# Patient Record
Sex: Male | Born: 1990 | Race: Black or African American | Hispanic: No | Marital: Single | Smoking: Former smoker
Health system: Southern US, Community
[De-identification: ages and names within clinical notes are randomized; demographics above are authoritative.]

## PROBLEM LIST (undated history)

## (undated) DIAGNOSIS — G43909 Migraine, unspecified, not intractable, without status migrainosus: Secondary | ICD-10-CM

## (undated) HISTORY — PX: NO PAST SURGERIES: SHX2092

## (undated) HISTORY — DX: Migraine, unspecified, not intractable, without status migrainosus: G43.909

## (undated) HISTORY — PX: WISDOM TOOTH EXTRACTION: SHX21

---
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
## 2018-04-03 NOTE — Progress Notes (Signed)
NEUROLOGY CONSULTATION NOTE  Clinton Barbarandrew M Muegge MRN: [id number] DOB: 03/09/1990  Referring provider: Benjiman CoreSuzanne Burks-Bermudez, MD Primary care provider: Benjiman CoreSuzanne Burks-Bermudez, MD  Reason for consult:  headaches  HISTORY OF PRESENT ILLNESS: Clinton Riley is a [age] year old male who presents for headaches.  History supplemented by referring provider note.  Onset:  2012 Location:  Back of head then becomes diffuse Quality:  pulsating Intensity:  Severe.  He denies new headache, thunderclap headache  Aura:  no Prodrome:  no Postdrome:  no Associated symptoms:  Nausea, photophobia, phonophobia, osmophobia, tinnitus, sometimes sees spots in vision.  He denies associated unilateral numbness or weakness. Duration:  2 to 4 hours but sometimes up to 2 days Frequency:  3 days of migraines per month, dull headache 15 days a month Frequency of abortive medication: usually doesn't take anything. Triggers:  Unknown Relieving factors:  Cold rag, massage temples, resting in dark and quiet room Activity:  aggravates  Current NSAIDS:  none Current analgesics:  Excedrin Migraine Current triptans:  none Current ergotamine:  none Current anti-emetic:  none Current muscle relaxants:  none Current anti-anxiolytic:  none Current sleep aide:  none Current Antihypertensive medications:  none Current Antidepressant medications:  none Current Anticonvulsant medications:  none Current anti-CGRP:  none Current Vitamins/Herbal/Supplements:  MVI Current Antihistamines/Decongestants:  none Other therapy:  none Other medication:  none  Past NSAIDS:  Naproxen 500mg , ibuprofen Past analgesics:  Fioricet, Tylenol Past abortive triptans:  Sumatriptan 50mg  Past abortive ergotamine:  none Past muscle relaxants:  Flexeril Past anti-emetic:  none Past antihypertensive medications:  none Past antidepressant medications:  none Past anticonvulsant medications:  none Past anti-CGRP:  none Past  vitamins/Herbal/Supplements:  none Past antihistamines/decongestants:  none Other past therapies:  acupuncture  Caffeine:  No coffee Diet:  Drinks 4 bottles of water daily, rarely soda Exercise:  Walks daily.   Depression:  no; Anxiety:  no Other pain:  Sometimes back pain Sleep hygiene:  Poor.  Has prior diagnosis of OSA.  He had a sleep study yesterday, stay waiting for results. Family history of headache:  No  01/04/18 LABS: NA 139, K3.7, CO2 28, CR 1.260, T bili 0.8, ALT 31, AST 30.  PAST MEDICAL HISTORY: Chronic low back pain (L4-L5) Questionable OSA  PAST SURGICAL HISTORY: None  MEDICATIONS: MVI  ALLERGIES: No known allergies  FAMILY HISTORY: No known family history  SOCIAL HISTORY: Marital status:  Single Lives with sister Tobacco use:  No Alcohol use:  No Illicit drug use:  No  REVIEW OF SYSTEMS: Constitutional: No fevers, chills, or sweats, no generalized fatigue, change in appetite Eyes: No visual changes, double vision, eye pain Ear, nose and throat: No hearing loss, ear pain, nasal congestion, sore throat Cardiovascular: No chest pain, palpitations Respiratory:  No shortness of breath at rest or with exertion, wheezes GastrointestinaI: No nausea, vomiting, diarrhea, abdominal pain, fecal incontinence Genitourinary:  No dysuria, urinary retention or frequency Musculoskeletal:  No neck pain, back pain Integumentary: No rash, pruritus, skin lesions Neurological: as above Psychiatric: No depression, insomnia, anxiety Endocrine: No palpitations, fatigue, diaphoresis, mood swings, change in appetite, change in weight, increased thirst Hematologic/Lymphatic:  No purpura, petechiae. Allergic/Immunologic: no itchy/runny eyes, nasal congestion, recent allergic reactions, rashes  PHYSICAL EXAM: Blood pressure 108/60, pulse 68, height 5\' 9"  (1.753 m), weight 196 lb (88.9 kg), SpO2 97 %. General: No acute distress.  Patient appears well-groomed.   Head:   Normocephalic/atraumatic Eyes:  fundi examined but not visualized Neck: supple, no paraspinal tenderness,  full range of motion Back: No paraspinal tenderness Heart: regular rate and rhythm Lungs: Clear to auscultation bilaterally. Vascular: No carotid bruits. Neurological Exam: Mental status: alert and oriented to person, place, and time, recent and remote memory intact, fund of knowledge intact, attention and concentration intact, speech fluent and not dysarthric, language intact. Cranial nerves: CN I: not tested CN II: pupils equal, round and reactive to light, visual fields intact CN III, IV, VI:  full range of motion, no nystagmus, no ptosis CN V: facial sensation intact CN VII: upper and lower face symmetric CN VIII: hearing intact CN IX, X: gag intact, uvula midline CN XI: sternocleidomastoid and trapezius muscles intact CN XII: tongue midline Bulk & Tone: normal, no fasciculations. Motor:  5/5 throughout  Sensation:  temperature and vibration sensation intact.   Deep Tendon Reflexes:  2+ throughout, toes downgoing.   Finger to nose testing:  Without dysmetria.   Heel to shin:  Without dysmetria.   Gait:  Normal station and stride.  Romberg negative.  IMPRESSION: Chronic migraine without aura, without status migrainosus, not intractable  PLAN: 1.  He would like to avoid medications.   2. For abortive therapy, rizatriptan 10mg  3.  Limit use of pain relievers to no more than 2 days out of week to prevent risk of rebound or medication-overuse headache. 4.  Keep headache diary 5.  Exercise, hydration, caffeine cessation, sleep hygiene, monitor for and avoid triggers 6.  Consider:  magnesium citrate 400mg  daily, riboflavin 400mg  daily, and coenzyme Q10 100mg  three times daily 7.  Follow up in 4 months.   Thank you for allowing me to take part in the care of this patient.  Shon Millet, DO  CC: Benjiman Core, MD

## 2018-04-04 ENCOUNTER — Encounter: Payer: Self-pay | Admitting: Neurology

## 2018-04-04 ENCOUNTER — Ambulatory Visit (INDEPENDENT_AMBULATORY_CARE_PROVIDER_SITE_OTHER): Payer: No Typology Code available for payment source | Admitting: Neurology

## 2018-04-04 VITALS — BP 108/60 | HR 68 | Ht 69.0 in | Wt 196.0 lb

## 2018-04-04 DIAGNOSIS — G43709 Chronic migraine without aura, not intractable, without status migrainosus: Secondary | ICD-10-CM | POA: Diagnosis not present

## 2018-04-04 MED ORDER — RIZATRIPTAN BENZOATE 10 MG PO TABS: ORAL_TABLET | ORAL | 3 refills | Status: DC

## 2018-04-04 NOTE — Patient Instructions (Signed)
Migraine Recommendations: 1.  We will not start a daily preventative medication at this time. 2.  Take rizatriptan 10mg  at earliest onset of headache.  May repeat dose once in 2 hours if needed.  Do not exceed two tablets in 24 hours. 3.  Limit use of pain relievers to no more than 2 days out of the week.  These medications include acetaminophen, ibuprofen, triptans and narcotics.  This will help reduce risk of rebound headaches. 4.  Be aware of common food triggers such as processed sweets, processed foods with nitrites (such as deli meat, hot dogs, sausages), foods with MSG, alcohol (such as wine), chocolate, certain cheeses, certain fruits (dried fruits, bananas, pineapple), vinegar, diet soda. 4.  Avoid caffeine 5.  Routine exercise 6.  Proper sleep hygiene 7.  Stay adequately hydrated with water 8.  Keep a headache diary. 9.  Maintain proper stress management. 10.  Do not skip meals. 11.  Consider supplements:  Magnesium citrate 400mg  to 600mg  daily, riboflavin 400mg , Coenzyme Q 10 100mg  three times daily.  Also consider butterbur. 12.  Follow up in 4 months.   Migraine Headache  A migraine headache is a very strong throbbing pain on one side or both sides of your head. Migraines can also cause other symptoms. Talk with your doctor about what things may bring on (trigger) your migraine headaches. Follow these instructions at home: Medicines  Take over-the-counter and prescription medicines only as told by your doctor.  Do not drive or use heavy machinery while taking prescription pain medicine.  To prevent or treat constipation while you are taking prescription pain medicine, your doctor may recommend that you: ? Drink enough fluid to keep your pee (urine) clear or pale yellow. ? Take over-the-counter or prescription medicines. ? Eat foods that are high in fiber. These include fresh fruits and vegetables, whole grains, and beans. ? Limit foods that are high in fat and processed  sugars. These include fried and sweet foods. Lifestyle  Avoid alcohol.  Do not use any products that contain nicotine or tobacco, such as cigarettes and e-cigarettes. If you need help quitting, ask your doctor.  Get at least 8 hours of sleep every night.  Limit your stress. General instructions   Keep a journal to find out what may bring on your migraines. For example, write down: ? What you eat and drink. ? How much sleep you get. ? Any change in what you eat or drink. ? Any change in your medicines.  If you have a migraine: ? Avoid things that make your symptoms worse, such as bright lights. ? It may help to lie down in a dark, quiet room. ? Do not drive or use heavy machinery. ? Ask your doctor what activities are safe for you.  Keep all follow-up visits as told by your doctor. This is important. Contact a doctor if:  You get a migraine that is different or worse than your usual migraines. Get help right away if:  Your migraine gets very bad.  You have a fever.  You have a stiff neck.  You have trouble seeing.  Your muscles feel weak or like you cannot control them.  You start to lose your balance a lot.  You start to have trouble walking.  You pass out (faint). This information is not intended to replace advice given to you by your health care provider. Make sure you discuss any questions you have with your health care provider. Document Released: 12/01/2007 Document Revised: 11/15/2017  Document Reviewed: 08/10/2015 Elsevier Interactive Patient Education  [employer].

## 2018-08-09 NOTE — Progress Notes (Signed)
Virtual Visit via Video Note The purpose of this virtual visit is to provide medical care while limiting exposure to the novel coronavirus.    Consent was obtained for video visit:  Yes.   Answered questions that patient had about telehealth interaction:  Yes.   I discussed the limitations, risks, security and privacy concerns of performing an evaluation and management service by telemedicine. I also discussed with the patient that there may be a patient responsible charge related to this service. The patient expressed understanding and agreed to proceed.  Pt location: Home Physician Location: office Name of referring provider:  No ref. provider found I connected with Clinton Riley at patients initiation/request on 08/10/2018 at  2:30 PM EDT by video enabled telemedicine application and verified that I am speaking with the correct person using two identifiers. Pt MRN:  [id number] Pt DOB:  01-28-1991 Video Participants:  Clinton Riley   History of Present Illness:  Clinton Riley is a 28 year old man who follows up for migraines.  UPDATE: Overall doing well.  He had one severe one where he had to leave work.  He has been taking Excedrin but has not picked up the rizatriptan from the [place] because he forgot about it.   Intensity:  Moderate to severe Duration:  Unsure because he usually immediately goes to sleep.  The severe one lasted about 2 hours. Frequency:  4 days a month (1 severe) in May.  Frequency fluctuates based on change in weather. Frequency of abortive medication: 4 days in a month Current NSAIDS:  none Current analgesics:  Excedrin Migraine Current triptans:  none Current ergotamine:  none Current anti-emetic:  none Current muscle relaxants:  none Current anti-anxiolytic:  none Current sleep aide:  none Current Antihypertensive medications:  none Current Antidepressant medications:  none Current Anticonvulsant medications:  none Current anti-CGRP:  none Current  Vitamins/Herbal/Supplements:  MVI Current Antihistamines/Decongestants:  none Other therapy:  none Other medication:  none  Caffeine:  No coffee Diet:  Drinks 4 bottles of water daily, rarely soda Exercise:  Walks daily.   Depression:  no; Anxiety:  no Other pain:  Sometimes back pain Sleep hygiene:  Poor.  Has prior diagnosis of OSA.  He is currently be worked up.    HISTORY: Onset:  2012 Location:  Back of head then becomes diffuse Quality:  pulsating Intensity:  Severe.  He denies new headache, thunderclap headache  Aura:  no Prodrome:  no Postdrome:  no Associated symptoms:  Nausea, photophobia, phonophobia, osmophobia, tinnitus, sometimes sees spots in vision.  He denies associated unilateral numbness or weakness. Duration:  2 to 4 hours but sometimes up to 2 days Frequency:  3 days of migraines per month, dull headache 15 days a month Frequency of abortive medication: usually doesn't take anything. Triggers:  Unknown Relieving factors:  Cold rag, massage temples, resting in dark and quiet room Activity:  aggravates  Past NSAIDS:  Naproxen 500mg , ibuprofen Past analgesics:  Fioricet, Tylenol Past abortive triptans:  Sumatriptan 50mg  Past abortive ergotamine:  none Past muscle relaxants:  Flexeril Past anti-emetic:  none Past antihypertensive medications:  none Past antidepressant medications:  none Past anticonvulsant medications:  none Past anti-CGRP:  none Past vitamins/Herbal/Supplements:  none Past antihistamines/decongestants:  none Other past therapies:  acupuncture  Family history of headache:  No  Past Medical History: No past medical history on file.  Medications: Outpatient Encounter Medications as of 08/10/2018  Medication Sig  . Multiple Vitamin (MULTIVITAMIN) tablet Take 1  tablet by mouth daily.  . rizatriptan (MAXALT) 10 MG tablet Take 1 tablet earliest onset of migraine.  May repeat once in 2 hours if needed.  Maximum 2 tablets in 24h   No  facility-administered encounter medications on file as of 08/10/2018.     Allergies: No Known Allergies  Family History: Family History  Problem Relation Age of Onset  . Unexplained death Mother 24    Social History: Social History   Socioeconomic History  . Marital status: Single    Spouse name: Not on file  . Number of children: Not on file  . Years of education: Not on file  . Highest education level: Some college, no degree  Occupational History  . Occupation: Health visitor  Social Needs  . Financial resource strain: Not on file  . Food insecurity:    Worry: Not on file    Inability: Not on file  . Transportation needs:    Medical: Not on file    Non-medical: Not on file  Tobacco Use  . Smoking status: Former Smoker    Types: Pipe  . Smokeless tobacco: Never Used  . Tobacco comment: hookah pipe  Substance and Sexual Activity  . Alcohol use: Yes    Comment: occasionally  . Drug use: Never  . Sexual activity: Not on file  Lifestyle  . Physical activity:    Days per week: Not on file    Minutes per session: Not on file  . Stress: Not on file  Relationships  . Social connections:    Talks on phone: Not on file    Gets together: Not on file    Attends religious service: Not on file    Active member of club or organization: Not on file    Attends meetings of clubs or organizations: Not on file    Relationship status: Not on file  . Intimate partner violence:    Fear of current or ex partner: Not on file    Emotionally abused: Not on file    Physically abused: Not on file    Forced sexual activity: Not on file  Other Topics Concern  . Not on file  Social History Narrative   Patient is right-handed. He lives alone in a one level home. He does not drink caffeine. He walks daily. He occasionally plays tennis and bikes.    Observations/Objective:   Height  (1.753 m), weight 185 lb (83.9 kg). No acute distress.  Alert and oriented.  Speech fluent and not  dysarthric.  Language intact.  Face symmetric.    Assessment and Plan:   Migraine without aura, without status migrainosus, not intractable  1.  No preventative medication warranted at this time 2.  Excedrin for abortive therapy.  He will have rizatriptan for rare events where headaches do not respond to Excedrin. 3.  Limit use of pain relievers to no more than 2 days out of week to prevent risk of rebound or medication-overuse headache. Headache diary 4.  Follow up on sleep apnea testing 5.  Follow up in 6 months.  Follow Up Instructions:    -I discussed the assessment and treatment plan with the patient. The patient was provided an opportunity to ask questions and all were answered. The patient agreed with the plan and demonstrated an understanding of the instructions.   The patient was advised to call back or seek an in-person evaluation if the symptoms worsen or if the condition fails to improve as anticipated.    Total  Time spent in visit with the patient was:  15 minutes  Cira ServantAdam Robert Jaffe, DO

## 2018-08-10 ENCOUNTER — Encounter: Payer: Self-pay | Admitting: Neurology

## 2018-08-10 ENCOUNTER — Telehealth (INDEPENDENT_AMBULATORY_CARE_PROVIDER_SITE_OTHER): Payer: No Typology Code available for payment source | Admitting: Neurology

## 2018-08-10 ENCOUNTER — Other Ambulatory Visit: Payer: Self-pay

## 2018-08-10 VITALS — Ht 69.0 in | Wt 185.0 lb

## 2018-08-10 DIAGNOSIS — G43009 Migraine without aura, not intractable, without status migrainosus: Secondary | ICD-10-CM | POA: Diagnosis not present

## 2018-08-10 DIAGNOSIS — G43909 Migraine, unspecified, not intractable, without status migrainosus: Secondary | ICD-10-CM | POA: Insufficient documentation

## 2019-02-11 NOTE — Progress Notes (Deleted)
Virtual Visit via Video Note The purpose of this virtual visit is to provide medical care while limiting exposure to the novel coronavirus.    Consent was obtained for video visit:  Yes.   Answered questions that patient had about telehealth interaction:  Yes.   I discussed the limitations, risks, security and privacy concerns of performing an evaluation and management service by telemedicine. I also discussed with the patient that there may be a patient responsible charge related to this service. The patient expressed understanding and agreed to proceed.  Pt location: Home Physician Location: office Name of referring provider:  No ref. provider found I connected with Clinton Riley at patients initiation/request on 02/12/2019 at  3:30 PM EST by video enabled telemedicine application and verified that I am speaking with the correct person using two identifiers. Pt MRN:  [id number] Pt DOB:  [birth year] Video Participants:  Clinton Riley   History of Present Illness:  Clinton Riley is a 28 year old man who follows up for migraines.  UPDATE: Intensity:  *** Duration:  *** Frequency:  *** Frequency of abortive medication: 4 days in a month Current NSAIDS:none Current analgesics:Excedrin Migraine Current triptans:none Current ergotamine:none Current anti-emetic:none Current muscle relaxants:none Current anti-anxiolytic:none Current sleep aide:none Current Antihypertensive medications:none Current Antidepressant medications:none Current Anticonvulsant medications:none Current anti-CGRP:none Current Vitamins/Herbal/Supplements:MVI Current Antihistamines/Decongestants:none Other therapy:none Other medication:none  Caffeine:No coffee Diet:Drinks 4 bottles of water daily, rarely soda Exercise:Walks daily. Depression:no; Anxiety:no Other pain:Sometimes back pain Sleep hygiene:Poor. Has prior diagnosis of OSA. He is  currently be worked up.    HISTORY:  Onset: 2012 Location:Back of head then becomes diffuse Quality:pulsating Initial intensity:Severe.Hedenies new headache, thunderclap headache  Aura:no Prodrome:no Postdrome:no Associated symptoms: Nausea, photophobia, phonophobia, osmophobia, tinnitus, sometimes sees spots in vision.Hedenies associated unilateral numbness or weakness. Initial duration:2 to 4 hours but sometimes up to 2 days Initial Frequency:3 days of migraines per month, dull headache 15 days a month Initial Frequency of abortive medication:usually doesn't take anything. Triggers: Unknown Relieving factors: Cold rag, massage temples, resting in dark and quiet room Activity:aggravates  Past NSAIDS:Naproxen 500mg , ibuprofen Past analgesics:Fioricet, Tylenol Past abortive triptans:Sumatriptan 50mg  Past abortive ergotamine:none Past muscle relaxants:Flexeril Past anti-emetic:none Past antihypertensive medications:none Past antidepressant medications:none Past anticonvulsant medications:none Past anti-CGRP:none Past vitamins/Herbal/Supplements:none Past antihistamines/decongestants:none Other past therapies:acupuncture  Family history of headache:No  Past Medical History: Past Medical History:  Diagnosis Date  . Migraines     Medications: Outpatient Encounter Medications as of 02/12/2019  Medication Sig  . aspirin-acetaminophen-caffeine (EXCEDRIN MIGRAINE) 250-250-65 MG tablet Take 2 tablets by mouth every 6 (six) hours as needed for headache.  . Multiple Vitamin (MULTIVITAMIN) tablet Take 1 tablet by mouth daily.  . rizatriptan (MAXALT) 10 MG tablet Take 1 tablet earliest onset of migraine.  May repeat once in 2 hours if needed.  Maximum 2 tablets in 24h   No facility-administered encounter medications on file as of 02/12/2019.     Allergies: No Known Allergies  Family History: Family History  Problem  Relation Age of Onset  . Unexplained death Mother 44    Social History: Social History   Socioeconomic History  . Marital status: Single    Spouse name: Not on file  . Number of children: 0  . Years of education: Not on file  . Highest education level: Some college, no degree  Occupational History  . Occupation: [employer]  Social Needs  . Financial resource strain: Not on file  . Food insecurity    Worry: Not on  file    Inability: Not on file  . Transportation needs    Medical: Not on file    Non-medical: Not on file  Tobacco Use  . Smoking status: Former Smoker    Types: Pipe  . Smokeless tobacco: Never Used  . Tobacco comment: hookah pipe  Substance and Sexual Activity  . Alcohol use: Not Currently    Frequency: Never    Comment: occasionally  . Drug use: Never  . Sexual activity: Not on file  Lifestyle  . Physical activity    Days per week: Not on file    Minutes per session: Not on file  . Stress: Not on file  Relationships  . Social Herbalist on phone: Not on file    Gets together: Not on file    Attends religious service: Not on file    Active member of club or organization: Not on file    Attends meetings of clubs or organizations: Not on file    Relationship status: Not on file  . Intimate partner violence    Fear of current or ex partner: Not on file    Emotionally abused: Not on file    Physically abused: Not on file    Forced sexual activity: Not on file  Other Topics Concern  . Not on file  Social History Narrative   Patient is right-handed. He lives alone in a one level home. He does not drink caffeine. He walks daily. He occasionally plays tennis and bikes.    Observations/Objective:   *** No acute distress.  Alert and oriented.  Speech fluent and not dysarthric.  Language intact.  Eyes orthophoric on primary gaze.  Face symmetric.  Assessment and Plan:   Migraine without aura, without status migrainosus, not intractable  1. No  preventative medication warranted at this time. 2.  For abortive therapy, Excedrin as first-line, rizatriptan as second line 3.  Limit use of pain relievers to no more than 2 days out of week to prevent risk of rebound or medication-overuse headache. 4.  Keep headache diary 5.  Exercise, hydration, caffeine cessation, sleep hygiene, monitor for and avoid triggers 6.  Consider:  magnesium citrate 400mg  daily, riboflavin 400mg  daily, and coenzyme Q10 100mg  three times daily 7. Follow up ***   Follow Up Instructions:    -I discussed the assessment and treatment plan with the patient. The patient was provided an opportunity to ask questions and all were answered. The patient agreed with the plan and demonstrated an understanding of the instructions.   The patient was advised to call back or seek an in-person evaluation if the symptoms worsen or if the condition fails to improve as anticipated.    Total Time spent in visit with the patient was:  ***, of which more than 50% of the time was spent in counseling and/or coordinating care on ***.   Pt understands and agrees with the plan of care outlined.     Dudley Major, DO

## 2019-02-12 ENCOUNTER — Ambulatory Visit: Payer: No Typology Code available for payment source | Admitting: Neurology

## 2019-03-19 ENCOUNTER — Encounter: Payer: Self-pay | Admitting: Neurology

## 2019-03-19 NOTE — Progress Notes (Signed)
Virtual Visit via Video Note The purpose of this virtual visit is to provide medical care while limiting exposure to the novel coronavirus.    Consent was obtained for video visit:  Yes.   Answered questions that patient had about telehealth interaction:  Yes.   I discussed the limitations, risks, security and privacy concerns of performing an evaluation and management service by telemedicine. I also discussed with the patient that there may be a patient responsible charge related to this service. The patient expressed understanding and agreed to proceed.  Pt location: Home Physician Location: office Name of referring provider:  No ref. provider found I connected with Clinton Riley at patients initiation/request on 03/20/2019 at 11:30 AM EST by video enabled telemedicine application and verified that I am speaking with the correct person using two identifiers. Pt MRN:  [id number] Pt DOB:  March 10, 1990 Video Participants:  Clinton Riley   History of Present Illness:  Clinton Riley is a 29 year old male who follows up for migraines.  UPDATE: Intensity:  severe Duration:  2 hours or until he falls asleep Frequency:  2 days a month Frequency of abortive medication: 2 days a month Rescue therapy:  Rizatriptan (Excedrin at work) Current NSAIDS:  none Current analgesics:  Excedrin Migraine Current triptans:  none Current ergotamine:  none Current anti-emetic:  none Current muscle relaxants:  none Current anti-anxiolytic:  none Current sleep aide:  none Current Antihypertensive medications:  none Current Antidepressant medications:  none Current Anticonvulsant medications:  none Current anti-CGRP:  none Current Vitamins/Herbal/Supplements:  MVI Current Antihistamines/Decongestants:  none Other therapy:  none Other medication:  none  Caffeine:  No coffee Diet:  Drinks 4 bottles of water daily, rarely soda Exercise:  Walks daily.   Depression:  no; Anxiety:  no Other  pain:  Sometimes back pain Sleep hygiene:  Poor.  Has prior diagnosis of OSA.  He had a sleep study yesterday, stay waiting for results. Marland Kitchen HISTORY: Onset:  2012 Location:  Back of head then becomes diffuse Quality:  pulsating Initial intensity:  Severe.  He denies new headache, thunderclap headache  Aura:  no Prodrome:  no Postdrome:  no Associated symptoms:  Nausea, photophobia, phonophobia, osmophobia, tinnitus, sometimes sees spots in vision.  He denies associated unilateral numbness or weakness. Initial duration:  2 to 4 hours but sometimes up to 2 days Initial frequency:  3 days of migraines per month, dull headache 15 days a month Initial frequency of abortive medication: usually doesn't take anything. Triggers:  Loud noise Relieving factors:  Cold rag, massage temples, resting in dark and quiet room Activity:  aggravates   Past NSAIDS:  Naproxen 500mg , ibuprofen Past analgesics:  Fioricet, Tylenol Past abortive triptans:  Sumatriptan 50mg  Past abortive ergotamine:  none Past muscle relaxants:  Flexeril Past anti-emetic:  none Past antihypertensive medications:  none Past antidepressant medications:  none Past anticonvulsant medications:  none Past anti-CGRP:  none Past vitamins/Herbal/Supplements:  none Past antihistamines/decongestants:  none Other past therapies:  acupuncture   Family history of headache:  No  Past Medical History: Past Medical History:  Diagnosis Date  . Migraines     Medications: Outpatient Encounter Medications as of 03/20/2019  Medication Sig  . aspirin-acetaminophen-caffeine (EXCEDRIN MIGRAINE) 250-250-65 MG tablet Take 2 tablets by mouth every 6 (six) hours as needed for headache.  . Multiple Vitamin (MULTIVITAMIN) tablet Take 1 tablet by mouth daily.  . rizatriptan (MAXALT) 10 MG tablet Take 1 tablet earliest onset of migraine.  May repeat once in 2 hours if needed.  Maximum 2 tablets in 24h   No facility-administered encounter  medications on file as of 03/20/2019.    Allergies: No Known Allergies  Family History: Family History  Problem Relation Age of Onset  . Unexplained death Mother 51    Social History: Social History   Socioeconomic History  . Marital status: Single    Spouse name: Not on file  . Number of children: 0  . Years of education: Not on file  . Highest education level: Some college, no degree  Occupational History  . Occupation: [employer]  Tobacco Use  . Smoking status: Former Smoker    Types: Pipe  . Smokeless tobacco: Never Used  . Tobacco comment: hookah pipe  Substance and Sexual Activity  . Alcohol use: Not Currently    Comment: occasionally  . Drug use: Never  . Sexual activity: Not on file  Other Topics Concern  . Not on file  Social History Narrative   Patient is right-handed. He lives alone in a one level home. He does not drink caffeine. He walks daily. He occasionally plays tennis and bikes.   Social Determinants of Health   Financial Resource Strain:   . Difficulty of Paying Living Expenses: Not on file  Food Insecurity:   . Worried About Charity fundraiser in the Last Year: Not on file  . Ran Out of Food in the Last Year: Not on file  Transportation Needs:   . Lack of Transportation (Medical): Not on file  . Lack of Transportation (Non-Medical): Not on file  Physical Activity:   . Days of Exercise per Week: Not on file  . Minutes of Exercise per Session: Not on file  Stress:   . Feeling of Stress : Not on file  Social Connections:   . Frequency of Communication with Friends and Family: Not on file  . Frequency of Social Gatherings with Friends and Family: Not on file  . Attends Religious Services: Not on file  . Active Member of Clubs or Organizations: Not on file  . Attends Archivist Meetings: Not on file  . Marital Status: Not on file  Intimate Partner Violence:   . Fear of Current or Ex-Partner: Not on file  . Emotionally Abused: Not  on file  . Physically Abused: Not on file  . Sexually Abused: Not on file    Observations/Objective:   Height 5\' 9"  (1.753 m), weight 200 lb (90.7 kg). No acute distress.  Alert and oriented.  Speech fluent and not dysarthric.  Language intact.  Eyes orthophoric on primary gaze.  Face symmetric.  Assessment and Plan:   Migraine without aura, without status migrainosus, not intractable  1.  For abortive therapy, rizatriptan (refilled) or Excedrin 2.  Limit use of pain relievers to no more than 2 days out of week to prevent risk of rebound or medication-overuse headache. 3.  Keep headache diary 4.  Exercise, hydration, caffeine cessation, sleep hygiene, monitor for and avoid triggers 5. Follow up one year   Follow Up Instructions:    -I discussed the assessment and treatment plan with the patient. The patient was provided an opportunity to ask questions and all were answered. The patient agreed with the plan and demonstrated an understanding of the instructions.   The patient was advised to call back or seek an in-person evaluation if the symptoms worsen or if the condition fails to improve as anticipated.   Dudley Major,  DO

## 2019-03-20 ENCOUNTER — Telehealth (INDEPENDENT_AMBULATORY_CARE_PROVIDER_SITE_OTHER): Payer: Managed Care, Other (non HMO) | Admitting: Neurology

## 2019-03-20 ENCOUNTER — Other Ambulatory Visit: Payer: Self-pay

## 2019-03-20 VITALS — Ht 69.0 in | Wt 200.0 lb

## 2019-03-20 DIAGNOSIS — G43009 Migraine without aura, not intractable, without status migrainosus: Secondary | ICD-10-CM

## 2019-03-20 MED ORDER — RIZATRIPTAN BENZOATE 10 MG PO TABS: ORAL_TABLET | ORAL | 11 refills | Status: DC

## 2020-03-17 NOTE — Progress Notes (Signed)
Virtual Visit via Video Note The purpose of this virtual visit is to provide medical care while limiting exposure to the novel coronavirus.    Consent was obtained for video visit:  Yes.   Answered questions that patient had about telehealth interaction:  Yes.   I discussed the limitations, risks, security and privacy concerns of performing an evaluation and management service by telemedicine. I also discussed with the patient that there may be a patient responsible charge related to this service. The patient expressed understanding and agreed to proceed.  Pt location: Home Physician Location: office Name of referring provider:  No ref. provider found I connected with Clinton Riley at patients initiation/request on 03/19/2020 at  8:50 AM EST by video enabled telemedicine application and verified that I am speaking with the correct person using two identifiers. Pt MRN:  [id number] Pt DOB:  [birth year] Video Participants:  Clinton Riley   History of Present Illness:  Clinton Riley is a 29 year oldmalewho follows up for migraines.Marland Kitchen  UPDATE: Intensity:  severe Duration:  2 hours or until he falls asleep Frequency:  2 days a month Frequency of abortive medication: 2 days a month.  However, reports increased headaches since December (about 4 a week).  He attributes it to PTSD related to his service in [place] (he kept hearing the sounds of bombs).  . Rescue therapy:  Rizatriptan (Excedrin at work) Current NSAIDS:none Current analgesics:Excedrin Migraine Current triptans:none Current ergotamine:none Current anti-emetic:none Current muscle relaxants:none Current anti-anxiolytic:none Current sleep aide:none Current Antihypertensive medications:none Current Antidepressant medications:none Current Anticonvulsant medications:none Current anti-CGRP:none Current Vitamins/Herbal/Supplements:MVI Current Antihistamines/Decongestants:none Other  therapy:none Other medication:none  Caffeine:No coffee Diet:Drinks 4 bottles of water daily, rarely soda.  Following more of an organic diet.   Exercise:Walks daily. Depression:no; Anxiety:yes.  PTSD.  Sees a therapist but pays out of pocket.   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 Initial duration:2 to 4 hours but sometimes up to 2 days Initial frequency:3 days of migraines per month, dull headache 15 days a month Initial frequency of abortive medication:usually doesn't take anything. Triggers:Loud noise Relieving factors:Cold rag, massage temples, resting in dark and quiet room Activity:aggravates   Past NSAIDS:Naproxen 500mg , ibuprofen Past analgesics:Fioricet, Tylenol Past abortive triptans:Sumatriptan 50mg  Past abortive ergotamine:none Past muscle relaxants:Flexeril Past anti-emetic:none Past antihypertensive medications:none Past antidepressant medications:many years ago, does not remember. Past anticonvulsant medications:none Past anti-CGRP:none Past vitamins/Herbal/Supplements:none Past antihistamines/decongestants:none Other past therapies:acupuncture   Family history of headache:No  Past Medical History: Past Medical History:  Diagnosis Date  . Migraines     Medications: Outpatient Encounter Medications as of 03/19/2020  Medication Sig  . aspirin-acetaminophen-caffeine (EXCEDRIN MIGRAINE) 250-250-65 MG tablet Take 2 tablets by mouth every 6 (six) hours as needed for headache.  .  Multiple Vitamin (MULTIVITAMIN) tablet Take 1 tablet by mouth daily.  . rizatriptan (MAXALT) 10 MG tablet Take 1 tablet earliest onset of migraine.  May repeat once in 2 hours if needed.  Maximum 2 tablets in 24h   No facility-administered encounter medications on file as of 03/19/2020.    Allergies: No Known Allergies  Family History: Family History  Problem Relation Age of Onset  . Unexplained death Mother 75    Social History: Social History   Socioeconomic History  .  Marital status: Single    Spouse name: Not on file  . Number of children: 0  . Years of education: Not on file  . Highest education level: Some college, no degree  Occupational History  . Occupation: [employer]  Tobacco Use  . Smoking status: Former Smoker    Types: Pipe  . Smokeless tobacco: Never Used  . Tobacco comment: hookah pipe  Vaping Use  . Vaping Use: Never used  Substance and Sexual Activity  . Alcohol use: Not Currently    Comment: occasionally  . Drug use: Never  . Sexual activity: Not on file  Other Topics Concern  . Not on file  Social History Narrative   Patient is right-handed. He lives alone in a one level home. He does not drink caffeine. He walks daily. He occasionally plays tennis and bikes.   Social Determinants of Health   Financial Resource Strain: Not on file  Food Insecurity: Not on file  Transportation Needs: Not on file  Physical Activity: Not on file  Stress: Not on file  Social Connections: Not on file  Intimate Partner Violence: Not on file    Observations/Objective:   Height 5\' 9"  (1.753 m), weight 180 lb (81.6 kg). No acute distress.  Alert and oriented.  Speech fluent and not dysarthric.  Language intact.    Assessment and Plan:   1.  Migraine without aura, without status migrainosus, not intractable - increased frequency which I suspect is related to aggravation of PTSD 2.  Probable PTSD  1.  Migraine prevention:  To address migraine and anxiety, start  venlafaxine XR 37.5mg  daily for one week, then 75mg  daily 2.  Migraine rescue:  Rizatriptan or Excedrin 3.  Limit use of pain relievers to no more than 2 days out of week to prevent risk of rebound or medication-overuse headache. 4.  Keep headache diary 5.  Continue seeing therapist.  [facility]. 6.  Follow up 6 months.  Follow Up Instructions:    -I discussed the assessment and treatment plan with the patient. The patient was provided an opportunity to ask questions and all were answered. The patient agreed with the plan and demonstrated an understanding of the instructions.   The patient was advised to call back or seek an in-person evaluation if the symptoms worsen or if the condition fails to improve as anticipated.   , DO

## 2020-03-19 ENCOUNTER — Other Ambulatory Visit: Payer: Self-pay

## 2020-03-19 ENCOUNTER — Telehealth (INDEPENDENT_AMBULATORY_CARE_PROVIDER_SITE_OTHER): Payer: Self-pay | Admitting: Neurology

## 2020-03-19 ENCOUNTER — Encounter: Payer: Self-pay | Admitting: Neurology

## 2020-03-19 VITALS — Ht 69.0 in | Wt 180.0 lb

## 2020-03-19 DIAGNOSIS — G43009 Migraine without aura, not intractable, without status migrainosus: Secondary | ICD-10-CM

## 2020-03-19 MED ORDER — VENLAFAXINE HCL ER 37.5 MG PO CP24: ORAL_CAPSULE | ORAL | 0 refills | Status: AC

## 2020-03-19 MED ORDER — RIZATRIPTAN BENZOATE 10 MG PO TABS: ORAL_TABLET | ORAL | 5 refills | Status: AC

## 2020-09-22 NOTE — Progress Notes (Deleted)
NEUROLOGY FOLLOW UP OFFICE NOTE  Clinton Riley [id number]  Assessment/Plan:    Migraine without aura Probable PTSD  Migraine prevention:  *** Migraine rescue:  *** Limit use of pain relievers to no more than 2 days out of week to prevent risk of rebound or medication-overuse headache. Keep headache diary Follow up ***   Subjective:  Clinton Riley is a 30 year old male who follows up for migraines.Marland Kitchen   UPDATE: Started venlafaxine in January Intensity:  severe Duration:  2 hours or until he falls asleep Frequency:  2 days a month Frequency of abortive medication: 2 days a month.  However, reports increased headaches since December (about 4 a week).  He attributes it to PTSD related to his service in [place] (he kept hearing the sounds of bombs).  . Rescue therapy:  Rizatriptan (Excedrin at work) Current NSAIDS:  none Current analgesics:  Excedrin Migraine Current triptans:  none Current ergotamine:  none Current anti-emetic:  none Current muscle relaxants:  none Current anti-anxiolytic:  none Current sleep aide:  none Current Antihypertensive medications:  none Current Antidepressant medications:  venlafaxine XR 75mg  QD Current Anticonvulsant medications:  none Current anti-CGRP:  none Current Vitamins/Herbal/Supplements:  MVI Current Antihistamines/Decongestants:  none Other therapy:  none Other medication:  none   Caffeine:  No coffee Diet:  Drinks 4 bottles of water daily, rarely soda.  Following more of an organic diet.   Exercise:  Walks daily.   Depression:  no; Anxiety:  yes.  PTSD.  Sees a therapist but pays out of pocket.   Other pain:  Sometimes back pain Sleep hygiene:  Has sleep-related rhythmic movement disorder    HISTORY:  Onset:  2012 Location:  Back of head then becomes diffuse Quality:  pulsating Initial intensity:  Severe.  He denies new headache, thunderclap headache Aura:  no Prodrome:  no Postdrome:  no Associated symptoms:   Nausea, photophobia, phonophobia, osmophobia, tinnitus, sometimes sees spots in vision.  He denies associated unilateral numbness or weakness. Initial duration:  2 to 4 hours but sometimes up to 2 days Initial frequency:  3 days of migraines per month, dull headache 15 days a month Initial frequency of abortive medication: usually doesn't take anything. Triggers:  Loud noise Relieving factors:  Cold rag, massage temples, resting in dark and quiet room Activity:  aggravates     Past NSAIDS:  Naproxen 500mg , ibuprofen Past analgesics:  Fioricet, Tylenol Past abortive triptans:  Sumatriptan 50mg  Past abortive ergotamine:  none Past muscle relaxants:  Flexeril Past anti-emetic:  none Past antihypertensive medications:  none Past antidepressant medications:  many years ago, does not remember. Past anticonvulsant medications:  none Past anti-CGRP:  none Past vitamins/Herbal/Supplements:  none Past antihistamines/decongestants:  none Other past therapies:  acupuncture     Family history of headache:  No  PAST MEDICAL HISTORY: Past Medical History:  Diagnosis Date   Migraines     MEDICATIONS: Current Outpatient Medications on File Prior to Visit  Medication Sig Dispense Refill   aspirin-acetaminophen-caffeine (EXCEDRIN MIGRAINE) 250-250-65 MG tablet Take 2 tablets by mouth every 6 (six) hours as needed for headache.     Multiple Vitamin (MULTIVITAMIN) tablet Take 1 tablet by mouth daily.     rizatriptan (MAXALT) 10 MG tablet Take 1 tablet earliest onset of migraine.  May repeat once in 2 hours if needed.  Maximum 2 tablets in 24h 10 tablet 5   venlafaxine XR (EFFEXOR XR) 37.5 MG 24 hr capsule Take 1 capsule daily  for one week, then increase to 2 capsules daily. 60 capsule 0   No current facility-administered medications on file prior to visit.    ALLERGIES: No Known Allergies  FAMILY HISTORY: Family History  Problem Relation Age of Onset   Unexplained death Mother 16       Objective:  *** General: No acute distress.  Patient appears ***-groomed.   Head:  Normocephalic/atraumatic Eyes:  Fundi examined but not visualized Neck: supple, no paraspinal tenderness, full range of motion Heart:  Regular rate and rhythm Lungs:  Clear to auscultation bilaterally Back: No paraspinal tenderness Neurological Exam: alert and oriented to person, place, and time.  Speech fluent and not dysarthric, language intact.  CN II-XII intact. Bulk and tone normal, muscle strength 5/5 throughout.  Sensation to light touch intact.  Deep tendon reflexes 2+ throughout, toes downgoing.  Finger to nose testing intact.  Gait normal, Romberg negative.   Shon Millet, DO

## 2020-09-24 ENCOUNTER — Ambulatory Visit: Payer: Self-pay | Admitting: Neurology

## 2020-10-30 NOTE — Progress Notes (Deleted)
NEUROLOGY FOLLOW UP OFFICE NOTE  Clinton Riley [id number]  Assessment/Plan:   Migraine without aura, without status migrainosus, not intractable  Migraine prevention:  *** Migraine rescue:  *** Limit use of pain relievers to no more than 2 days out of week to prevent risk of rebound or medication-overuse headache. Keep headache diary Follow up ***   Subjective:  Clinton Riley is a 30 year old male who follows up for migraines.Marland Kitchen   UPDATE: Started venlafaxine in January. Intensity:  severe Duration:  2 hours or until he falls asleep Frequency:  2 days a month Frequency of abortive medication: 2 days a month.  However, reports increased headaches since December (about 4 a week).  He attributes it to PTSD related to his service in [place] (he kept hearing the sounds of bombs).  . Rescue therapy:  Rizatriptan (Excedrin at work) Current NSAIDS:  none Current analgesics:  Excedrin Migraine Current triptans:  none Current ergotamine:  none Current anti-emetic:  none Current muscle relaxants:  none Current anti-anxiolytic:  none Current sleep aide:  none Current Antihypertensive medications:  none Current Antidepressant medications:  venlafaxine XR 75mg  daily Current Anticonvulsant medications:  none Current anti-CGRP:  none Current Vitamins/Herbal/Supplements:  MVI Current Antihistamines/Decongestants:  none Other therapy:  none Other medication:  none   Caffeine:  No coffee Diet:  Drinks 4 bottles of water daily, rarely soda.  Following more of an organic diet.   Exercise:  Walks daily.   Depression:  no; Anxiety:  yes.  PTSD.  Sees a therapist but pays out of pocket.   Other pain:  Sometimes back pain Sleep hygiene:  Has sleep-related rhythmic movement disorder    HISTORY:  Onset:  2012 Location:  Back of head then becomes diffuse Quality:  pulsating Initial intensity:  Severe.  He denies new headache, thunderclap headache  Aura:  no Prodrome:   no Postdrome:  no Associated symptoms:  Nausea, photophobia, phonophobia, osmophobia, tinnitus, sometimes sees spots in vision.  He denies associated unilateral numbness or weakness. Initial duration:  2 to 4 hours but sometimes up to 2 days Initial frequency:  3 days of migraines per month, dull headache 15 days a month Initial frequency of abortive medication: usually doesn't take anything. Triggers:  Loud noise Relieving factors:  Cold rag, massage temples, resting in dark and quiet room Activity:  aggravates     Past NSAIDS:  Naproxen 500mg , ibuprofen Past analgesics:  Fioricet, Tylenol Past abortive triptans:  Sumatriptan 50mg  Past abortive ergotamine:  none Past muscle relaxants:  Flexeril Past anti-emetic:  none Past antihypertensive medications:  none Past antidepressant medications:  many years ago, does not remember. Past anticonvulsant medications:  none Past anti-CGRP:  none Past vitamins/Herbal/Supplements:  none Past antihistamines/decongestants:  none Other past therapies:  acupuncture     Family history of headache:  No  PAST MEDICAL HISTORY: Past Medical History:  Diagnosis Date   Migraines     MEDICATIONS: Current Outpatient Medications on File Prior to Visit  Medication Sig Dispense Refill   aspirin-acetaminophen-caffeine (EXCEDRIN MIGRAINE) 250-250-65 MG tablet Take 2 tablets by mouth every 6 (six) hours as needed for headache.     Multiple Vitamin (MULTIVITAMIN) tablet Take 1 tablet by mouth daily.     rizatriptan (MAXALT) 10 MG tablet Take 1 tablet earliest onset of migraine.  May repeat once in 2 hours if needed.  Maximum 2 tablets in 24h 10 tablet 5   venlafaxine XR (EFFEXOR XR) 37.5 MG 24 hr capsule Take  1 capsule daily for one week, then increase to 2 capsules daily. 60 capsule 0   No current facility-administered medications on file prior to visit.    ALLERGIES: No Known Allergies  FAMILY HISTORY: Family History  Problem Relation Age of  Onset   Unexplained death Mother 74      Objective:  *** General: No acute distress.  Patient appears ***-groomed.   Head:  Normocephalic/atraumatic Eyes:  Fundi examined but not visualized Neck: supple, no paraspinal tenderness, full range of motion Heart:  Regular rate and rhythm Lungs:  Clear to auscultation bilaterally Back: No paraspinal tenderness Neurological Exam: alert and oriented to person, place, and time.  Speech fluent and not dysarthric, language intact.  CN II-XII intact. Bulk and tone normal, muscle strength 5/5 throughout.  Sensation to light touch intact.  Deep tendon reflexes 2+ throughout, toes downgoing.  Finger to nose testing intact.  Gait normal, Romberg negative.   Shon Millet, DO  CC: ***

## 2020-11-02 ENCOUNTER — Ambulatory Visit: Payer: Self-pay | Admitting: Neurology

## 2022-08-20 ENCOUNTER — Ambulatory Visit (INDEPENDENT_AMBULATORY_CARE_PROVIDER_SITE_OTHER): Payer: Self-pay

## 2022-08-20 ENCOUNTER — Ambulatory Visit
Admission: EM | Admit: 2022-08-20 | Discharge: 2022-08-20 | Disposition: A | Discharge status: Home / Self Care | Payer: No Typology Code available for payment source | Attending: Family | Admitting: Family

## 2022-08-20 DIAGNOSIS — M25522 Pain in left elbow: Secondary | ICD-10-CM

## 2022-08-20 NOTE — ED Provider Notes (Signed)
[facility]    CSN: [id number] Arrival date & time: 08/20/22  1131     History   Chief Complaint Chief Complaint  Patient presents with   Extremity Pain   HPI EMERSEN BARCENA is a 32 y.o. male who presents to the urgent care this morning with pain to his left elbow.  Patient reports pain started last night while he was taking his shirt off. He felt pain immediately, which has been getting worse with activities including walking down the stairs and opening the door.  He is having difficulty taking shower as well due to pain.  He reports no history of injury to the joint in the past.  No treatment taken his symptoms.  Patient reports no chest pain, shortness of breath, swelling, redness today joint, or any other symptom than above.  Pain is 4-5 out of 10 on a scale of 0-10.  Past Medical History:  Diagnosis Date   Migraines     Patient Active Problem List   Diagnosis Date Noted   Migraines 08/10/2018    Past Surgical History:  Procedure Laterality Date   NO PAST SURGERIES     WISDOM TOOTH EXTRACTION        Home Medications    Prior to Admission medications   Medication Sig Start Date End Date Taking? Authorizing Provider  aspirin-acetaminophen-caffeine (EXCEDRIN MIGRAINE) [phone] MG tablet Take 2 tablets by mouth every 6 (six) hours as needed for headache.    [provider]  Multiple Vitamin (MULTIVITAMIN) tablet Take 1 tablet by mouth daily.    [provider]  rizatriptan (MAXALT) 10 MG tablet Take 1 tablet earliest onset of migraine.  May repeat once in 2 hours if needed.  Maximum 2 tablets in 24h 03/19/20   Everlena Cooper, Adam R, DO  venlafaxine XR (EFFEXOR XR) 37.5 MG 24 hr capsule Take 1 capsule daily for one week, then increase to 2 capsules daily. 03/19/20   Drema Dallas, DO    Family History Family History  Problem Relation Age of Onset   Unexplained death Mother 14    Social History Social History   Tobacco Use   Smoking status:  Former    Types: Pipe   Smokeless tobacco: Never   Tobacco comments:    hookah pipe  Vaping Use   Vaping Use: Never used  Substance Use Topics   Alcohol use: Not Currently    Comment: occasionally   Drug use: Never     Allergies   Patient has no known allergies.   Review of Systems Review of Systems  Constitutional: Negative.   HENT: Negative.    Eyes: Negative.   Respiratory: Negative.    Cardiovascular: Negative.   Gastrointestinal: Negative.   Genitourinary: Negative.   Musculoskeletal:        Pain to left elbow.  Skin: Negative.      Physical Exam Triage Vital Signs ED Triage Vitals  Enc Vitals Group     BP 08/20/22 1145 139/82     Pulse Rate 08/20/22 1140 85     Resp 08/20/22 1140 18     Temp 08/20/22 1140 98.5 F (36.9 C)     Temp src --      SpO2 08/20/22 1140 97 %     Weight --      Height --      Head Circumference --      Peak Flow --      Pain Score 08/20/22 1140 9  Pain Loc --      Pain Edu? --      Excl. in GC? --    No data found.  Updated Vital Signs BP 139/82   Pulse 85   Temp 98.5 F (36.9 C)   Resp 18   SpO2 97%   Visual Acuity Right Eye Distance:   Left Eye Distance:   Bilateral Distance:    Right Eye Near:   Left Eye Near:    Bilateral Near:     Physical Exam Constitutional:      Appearance: Normal appearance.  HENT:     Head: Normocephalic.  Cardiovascular:     Rate and Rhythm: Normal rate and regular rhythm.  Pulmonary:     Effort: Pulmonary effort is normal.     Breath sounds: Normal breath sounds.  Abdominal:     General: Bowel sounds are normal.     Palpations: Abdomen is soft.  Musculoskeletal:        General: Tenderness present.     Comments: Pain to left elbow with extension and flexion of the joint.  No swelling, redness or warmth appreciated.  Patient unable to flex and extend the left elbow.  Decreased strength to left hand appreciated.  Skin:    General: Skin is warm.  Neurological:      Mental Status: He is alert.      UC Treatments / Results  Labs (all labs ordered are listed, but only abnormal results are displayed) Labs Reviewed - No data to display  EKG   Radiology No results found.  Procedures Procedures (including critical care time)  Medications Ordered in UC Medications - No data to display  Initial Impression / Assessment and Plan / UC Course  I have reviewed the triage vital signs and the nursing notes.  Pertinent labs & imaging results that were available during my care of the patient were reviewed by me and considered in my medical decision making (see chart for details).     -Mr. Josh is seen this morning with pain to left elbow since yesterday. Pain started when patient  was removing his shirt last night. He has no history of injury to the joint in the past. Patient instructed to take Tylenol or ibuprofen as needed, limit the use of the joint until symptoms are resolve and report new or worsening symptoms to the primary clinic or local emergency room.  Verbalized understanding.  Final Clinical Impressions(s) / UC Diagnoses   Final diagnoses:  Left elbow pain   Discharge Instructions   None    ED Prescriptions   None    PDMP not reviewed this encounter.   Eleonore Chiquito, FNP 08/20/22 1228

## 2022-08-20 NOTE — Discharge Instructions (Addendum)
-   Take Tylenol or ibuprofen as needed for your symptoms.  Take ibuprofen with food. - Limit the use of the left upper extremity until you recover. - Report new or worsening symptoms to the emergency room or your family doctor's office.

## 2022-08-20 NOTE — ED Triage Notes (Signed)
Patient to Urgent Care with complaints of left sided arm pain that started yesterday.   Describes sharp pain in his elbow, radiates into his shoulder/ arm. Reports pain was first noticed when he removed his shirt. Worse when opening doors/ pushing up/ putting weight onto his elbow.  Denies hx/ any known injury.

## 2023-08-21 ENCOUNTER — Other Ambulatory Visit: Payer: Self-pay

## 2023-08-21 ENCOUNTER — Emergency Department
Admission: EM | Admit: 2023-08-21 | Discharge: 2023-08-21 | Disposition: A | Discharge status: Home / Self Care | Attending: Emergency Medicine | Admitting: Emergency Medicine

## 2023-08-21 DIAGNOSIS — R112 Nausea with vomiting, unspecified: Secondary | ICD-10-CM | POA: Diagnosis present

## 2023-08-21 DIAGNOSIS — K529 Noninfective gastroenteritis and colitis, unspecified: Secondary | ICD-10-CM | POA: Diagnosis not present

## 2023-08-21 LAB — COMPREHENSIVE METABOLIC PANEL WITH GFR
ALT: 27 U/L (ref 0–44)
AST: 28 U/L (ref 15–41)
Albumin: 4.3 g/dL (ref 3.5–5.0)
Alkaline Phosphatase: 48 U/L (ref 38–126)
Anion gap: 9 (ref 5–15)
BUN: 17 mg/dL (ref 6–20)
CO2: 24 mmol/L (ref 22–32)
Calcium: 9.4 mg/dL (ref 8.9–10.3)
Chloride: 104 mmol/L (ref 98–111)
Creatinine, Ser: 1.06 mg/dL (ref 0.61–1.24)
GFR, Estimated: >60 mL/min (ref >60)
Glucose, Bld: 136 mg/dL — ABNORMAL HIGH (ref 70–99)
Potassium: 3.7 mmol/L (ref 3.5–5.1)
Sodium: 137 mmol/L (ref 135–145)
Total Bilirubin: 0.8 mg/dL (ref 0.0–1.2)
Total Protein: 8.1 g/dL (ref 6.5–8.1)

## 2023-08-21 LAB — URINALYSIS, ROUTINE W REFLEX MICROSCOPIC
Bilirubin Urine: NEGATIVE
Glucose, UA: NEGATIVE mg/dL
Hgb urine dipstick: NEGATIVE
Ketones, ur: NEGATIVE mg/dL
Leukocytes,Ua: NEGATIVE
Nitrite: NEGATIVE
Protein, ur: NEGATIVE mg/dL
Specific Gravity, Urine: 1.01 (ref 1.005–1.030)
pH: 6 (ref 5.0–8.0)

## 2023-08-21 LAB — CBC
HCT: 44.5 % (ref 39.0–52.0)
Hemoglobin: 14.9 g/dL (ref 13.0–17.0)
MCH: 29.5 pg (ref 26.0–34.0)
MCHC: 33.5 g/dL (ref 30.0–36.0)
MCV: 88.1 fL (ref 80.0–100.0)
Platelets: 288 10*3/uL (ref 150–400)
RBC: 5.05 MIL/uL (ref 4.22–5.81)
RDW: 13.3 % (ref 11.5–15.5)
WBC: 6.8 10*3/uL (ref 4.0–10.5)
nRBC: 0 % (ref 0.0–0.2)

## 2023-08-21 LAB — LIPASE, BLOOD: Lipase: 42 U/L (ref 11–51)

## 2023-08-21 MED ORDER — ONDANSETRON 4 MG PO TBDP: 4.0000 mg | ORAL_TABLET | Freq: Three times a day (TID) | ORAL | 0 refills | Status: AC | PRN

## 2023-08-21 MED ORDER — ONDANSETRON 4 MG PO TBDP
4.0000 mg | ORAL_TABLET | Freq: Once | ORAL | Status: AC
  Administered 2023-08-21: 4 mg via ORAL
  Filled 2023-08-21: qty 1

## 2023-08-21 NOTE — ED Notes (Signed)
 See triage note  Presents with sudden onset of feeling hot  then vomited about 4-5 days

## 2023-08-21 NOTE — ED Provider Notes (Signed)
 [facility] Provider Note    Event Date/Time   First MD Initiated Contact with Patient 08/21/23 1234     (approximate)   History   Emesis   HPI  Clinton Riley is a 33 y.o. male who presents with nausea vomiting and diarrhea which started this morning.  He denies abdominal pain.  No sick contacts reported.  No recent travel.  No fevers.     Physical Exam   Triage Vital Signs: ED Triage Vitals  Encounter Vitals Group     BP 08/21/23 1203 (!) 144/113     Girls Systolic BP Percentile --      Girls Diastolic BP Percentile --      Boys Systolic BP Percentile --      Boys Diastolic BP Percentile --      Pulse Rate 08/21/23 1203 65     Resp 08/21/23 1203 18     Temp 08/21/23 1203 98 F (36.7 C)     Temp src --      SpO2 08/21/23 1203 100 %     Weight 08/21/23 1202 92.1 kg (203 lb)     Height 08/21/23 1202 1.753 m (5' 9)     Head Circumference --      Peak Flow --      Pain Score 08/21/23 1202 5     Pain Loc --      Pain Education --      Exclude from Growth Chart --     Most recent vital signs: Vitals:   08/21/23 1203  BP: (!) 144/113  Pulse: 65  Resp: 18  Temp: 98 F (36.7 C)  SpO2: 100%     General: Awake, no distress.  CV:  Good peripheral perfusion.  Resp:  Normal effort.  Abd:  No distention.  Soft, nontender  other:     ED Results / Procedures / Treatments   Labs (all labs ordered are listed, but only abnormal results are displayed) Labs Reviewed  COMPREHENSIVE METABOLIC PANEL WITH GFR - Abnormal; Notable for the following components:      Result Value   Glucose, Bld 136 (*)    All other components within normal limits  URINALYSIS, ROUTINE W REFLEX MICROSCOPIC - Abnormal; Notable for the following components:   Color, Urine STRAW (*)    APPearance CLEAR (*)    All other components within normal limits  LIPASE, BLOOD  CBC     EKG     RADIOLOGY     PROCEDURES:  Critical Care performed:    Procedures   MEDICATIONS ORDERED IN ED: Medications  ondansetron (ZOFRAN-ODT) disintegrating tablet 4 mg (4 mg Oral Given 08/21/23 1304)     IMPRESSION / MDM / ASSESSMENT AND PLAN / ED COURSE  I reviewed the triage vital signs and the nursing notes. Patient's presentation is most consistent with acute illness / injury with system symptoms.  Patient presents with nausea vomiting diarrhea as detailed above, highly suspicious for foodborne illness versus viral gastroenteritis.  Overall well-appearing here and in no acute distress.  Lab work is reassuring.  Abdominal exam is benign.  Will treat symptomatically, work note provided, outpatient follow-up, strict turn precautions.        FINAL CLINICAL IMPRESSION(S) / ED DIAGNOSES   Final diagnoses:  Gastroenteritis     Rx / DC Orders   ED Discharge Orders          Ordered    ondansetron (ZOFRAN-ODT) 4 MG disintegrating tablet  Every 8 hours PRN        08/21/23 1254             Note:  This document was prepared using Dragon voice recognition software and may include unintentional dictation errors.   [name], MD 08/21/23 1341

## 2023-08-21 NOTE — ED Triage Notes (Signed)
 Pt comes with belly pain, headache and vomiting that started this morning. Pt states he had similar episode three weeks but didn't get any treatment for it.
